# Patient Record
Sex: Male | Born: 1980 | Race: Black or African American | Hispanic: No | Marital: Single
Health system: Southern US, Community
[De-identification: ages and names within clinical notes are randomized; demographics above are authoritative.]

---

## 2006-01-01 ENCOUNTER — Emergency Department: Payer: Self-pay | Admitting: Emergency Medicine

## 2006-05-06 ENCOUNTER — Emergency Department: Payer: Self-pay | Admitting: Emergency Medicine

## 2007-05-14 ENCOUNTER — Emergency Department: Payer: Self-pay | Admitting: Emergency Medicine

## 2008-05-25 ENCOUNTER — Emergency Department: Payer: Self-pay | Admitting: Emergency Medicine

## 2009-07-28 ENCOUNTER — Emergency Department: Payer: Self-pay | Admitting: Emergency Medicine

## 2009-08-20 ENCOUNTER — Emergency Department: Payer: Self-pay | Admitting: Internal Medicine

## 2009-08-22 ENCOUNTER — Emergency Department: Payer: Self-pay | Admitting: Emergency Medicine

## 2009-08-25 ENCOUNTER — Emergency Department: Payer: Self-pay | Admitting: Emergency Medicine

## 2009-08-27 ENCOUNTER — Emergency Department: Payer: Self-pay | Admitting: Emergency Medicine

## 2012-08-11 ENCOUNTER — Emergency Department: Payer: Self-pay | Admitting: Emergency Medicine

## 2013-06-17 ENCOUNTER — Emergency Department: Payer: Self-pay | Admitting: Emergency Medicine

## 2013-12-24 ENCOUNTER — Emergency Department: Payer: Self-pay | Admitting: Emergency Medicine

## 2014-07-05 ENCOUNTER — Emergency Department: Payer: Self-pay | Admitting: Internal Medicine

## 2015-03-06 ENCOUNTER — Emergency Department: Payer: Self-pay | Admitting: Emergency Medicine

## 2021-02-25 ENCOUNTER — Emergency Department: Payer: Medicaid Other

## 2021-02-25 ENCOUNTER — Emergency Department
Admission: EM | Admit: 2021-02-25 | Discharge: 2021-02-25 | Disposition: A | Discharge status: Other Acute Inpatient Hospital | Payer: Medicaid Other | Attending: Emergency Medicine | Admitting: Emergency Medicine

## 2021-02-25 DIAGNOSIS — S0190XA Unspecified open wound of unspecified part of head, initial encounter: Secondary | ICD-10-CM | POA: Diagnosis not present

## 2021-02-25 DIAGNOSIS — Z20822 Contact with and (suspected) exposure to covid-19: Secondary | ICD-10-CM | POA: Insufficient documentation

## 2021-02-25 DIAGNOSIS — W3400XA Accidental discharge from unspecified firearms or gun, initial encounter: Secondary | ICD-10-CM | POA: Diagnosis not present

## 2021-02-25 DIAGNOSIS — S0990XA Unspecified injury of head, initial encounter: Secondary | ICD-10-CM | POA: Diagnosis present

## 2021-02-25 DIAGNOSIS — Z23 Encounter for immunization: Secondary | ICD-10-CM | POA: Insufficient documentation

## 2021-02-25 LAB — CBC WITH DIFFERENTIAL/PLATELET
Abs Immature Granulocytes: 0.02 10*3/uL (ref 0.00–0.07)
Basophils Absolute: 0 10*3/uL (ref 0.0–0.1)
Basophils Relative: 0 %
Eosinophils Absolute: 0 10*3/uL (ref 0.0–0.5)
Eosinophils Relative: 0 %
HCT: 39.9 % (ref 39.0–52.0)
Hemoglobin: 13.3 g/dL (ref 13.0–17.0)
Immature Granulocytes: 0 %
Lymphocytes Relative: 32 %
Lymphs Abs: 2.5 10*3/uL (ref 0.7–4.0)
MCH: 28.7 pg (ref 26.0–34.0)
MCHC: 33.3 g/dL (ref 30.0–36.0)
MCV: 86.2 fL (ref 80.0–100.0)
Monocytes Absolute: 0.6 10*3/uL (ref 0.1–1.0)
Monocytes Relative: 8 %
Neutro Abs: 4.6 10*3/uL (ref 1.7–7.7)
Neutrophils Relative %: 60 %
Platelets: 295 10*3/uL (ref 150–400)
RBC: 4.63 MIL/uL (ref 4.22–5.81)
RDW: 13.4 % (ref 11.5–15.5)
WBC: 7.7 10*3/uL (ref 4.0–10.5)
nRBC: 0 % (ref 0.0–0.2)

## 2021-02-25 LAB — COMPREHENSIVE METABOLIC PANEL WITH GFR
ALT: 26 U/L (ref 0–44)
AST: 38 U/L (ref 15–41)
Albumin: 4.5 g/dL (ref 3.5–5.0)
Alkaline Phosphatase: 66 U/L (ref 38–126)
Anion gap: 19 — ABNORMAL HIGH (ref 5–15)
BUN: 12 mg/dL (ref 6–20)
CO2: 15 mmol/L — ABNORMAL LOW (ref 22–32)
Calcium: 8.8 mg/dL — ABNORMAL LOW (ref 8.9–10.3)
Chloride: 105 mmol/L (ref 98–111)
Creatinine, Ser: 2.02 mg/dL — ABNORMAL HIGH (ref 0.61–1.24)
GFR, Estimated: 42 mL/min — ABNORMAL LOW
Glucose, Bld: 173 mg/dL — ABNORMAL HIGH (ref 70–99)
Potassium: 2.8 mmol/L — ABNORMAL LOW (ref 3.5–5.1)
Sodium: 139 mmol/L (ref 135–145)
Total Bilirubin: 1 mg/dL (ref 0.3–1.2)
Total Protein: 7.8 g/dL (ref 6.5–8.1)

## 2021-02-25 LAB — RESP PANEL BY RT-PCR (FLU A&B, COVID) ARPGX2
Influenza A by PCR: NEGATIVE
Influenza B by PCR: NEGATIVE
SARS Coronavirus 2 by RT PCR: NEGATIVE

## 2021-02-25 LAB — PROTIME-INR
INR: 1.1 (ref 0.8–1.2)
Prothrombin Time: 13.3 s (ref 11.4–15.2)

## 2021-02-25 LAB — CBG MONITORING, ED: Glucose-Capillary: 172 mg/dL — ABNORMAL HIGH (ref 70–99)

## 2021-02-25 LAB — ABO/RH: ABO/RH(D): O POS

## 2021-02-25 LAB — ETHANOL: Alcohol, Ethyl (B): 50 mg/dL — ABNORMAL HIGH

## 2021-02-25 MED ORDER — CEFAZOLIN SODIUM-DEXTROSE 1-4 GM/50ML-% IV SOLN: 1.0000 g | Freq: Once | INTRAVENOUS | Status: DC

## 2021-02-25 MED ORDER — SODIUM CHLORIDE 0.9 % IV BOLUS
1000.0000 mL | Freq: Once | INTRAVENOUS | Status: AC
  Administered 2021-02-25: 1000 mL via INTRAVENOUS

## 2021-02-25 MED ORDER — TRANEXAMIC ACID-NACL 1000-0.7 MG/100ML-% IV SOLN
1000.0000 mg | INTRAVENOUS | Status: AC
  Administered 2021-02-25: 1000 mg via INTRAVENOUS
  Filled 2021-02-25: qty 100

## 2021-02-25 MED ORDER — CEFAZOLIN SODIUM 1 G IJ SOLR
INTRAMUSCULAR | Status: AC
  Filled 2021-02-25: qty 10

## 2021-02-25 MED ORDER — SODIUM CHLORIDE 0.9 % IV SOLN
10.0000 mL/h | Freq: Once | INTRAVENOUS | Status: AC
  Administered 2021-02-25: 10 mL/h via INTRAVENOUS

## 2021-02-25 MED ORDER — TETANUS-DIPHTH-ACELL PERTUSSIS 5-2.5-18.5 LF-MCG/0.5 IM SUSY
0.5000 mL | PREFILLED_SYRINGE | Freq: Once | INTRAMUSCULAR | Status: AC
  Administered 2021-02-25: 0.5 mL via INTRAMUSCULAR
  Filled 2021-02-25: qty 0.5
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
## 2021-02-25 NOTE — ED Notes (Signed)
[facility] air transport arrived at this time.
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
## 2021-02-25 NOTE — ED Notes (Signed)
Blood bank called at this time and states the emergent blood was compatible now that ABO/RH is completed.

## 2021-02-25 NOTE — ED Notes (Signed)
Verbal consent for transfer to [facility]
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
## 2021-02-25 NOTE — ED Notes (Signed)
Vet, NT placed pt earring in specimen bag labeled pt ID sticker. Specimen bag placed inside bag with belongings.

## 2021-02-25 NOTE — ED Notes (Signed)
Pt returned from CT at this time.  

## 2021-02-25 NOTE — ED Notes (Signed)
Pt belonging bags given to [facility].

## 2021-02-25 NOTE — ED Notes (Signed)
Multiple RNs at bedside at this time. First emergent unit of blood started via rapid infuser

## 2021-02-25 NOTE — ED Notes (Signed)
Report given Landon  By Jeannett Senior, RN.

## 2021-02-26 LAB — TYPE AND SCREEN
ABO/RH(D): O POS
Antibody Screen: NEGATIVE
Unit division: 0
Unit division: 0

## 2021-02-26 LAB — BPAM RBC
Blood Product Expiration Date: 202204022359
Blood Product Expiration Date: 202204022359
ISSUE DATE / TIME: 202203030111
ISSUE DATE / TIME: 202203030111
Unit Type and Rh: 5100
Unit Type and Rh: 5100

## 2021-02-26 LAB — PREPARE RBC (CROSSMATCH)
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
